# Patient Record
Sex: Female | Born: 1995
Health system: Southern US, Community
[De-identification: ages and names within clinical notes are randomized; demographics above are authoritative.]

---

## 2020-02-29 ENCOUNTER — Ambulatory Visit: Payer: Self-pay

## 2021-03-08 ENCOUNTER — Emergency Department (HOSPITAL_COMMUNITY): Payer: BC Managed Care – PPO

## 2021-03-08 ENCOUNTER — Emergency Department (HOSPITAL_COMMUNITY)
Admission: EM | Admit: 2021-03-08 | Discharge: 2021-03-08 | Disposition: A | Discharge status: Home / Self Care | Payer: BC Managed Care – PPO | Attending: Emergency Medicine | Admitting: Emergency Medicine

## 2021-03-08 ENCOUNTER — Encounter (HOSPITAL_COMMUNITY): Payer: Self-pay | Admitting: Emergency Medicine

## 2021-03-08 DIAGNOSIS — R1013 Epigastric pain: Secondary | ICD-10-CM | POA: Insufficient documentation

## 2021-03-08 DIAGNOSIS — R1011 Right upper quadrant pain: Secondary | ICD-10-CM | POA: Diagnosis not present

## 2021-03-08 DIAGNOSIS — R1010 Upper abdominal pain, unspecified: Secondary | ICD-10-CM

## 2021-03-08 DIAGNOSIS — R1012 Left upper quadrant pain: Secondary | ICD-10-CM | POA: Diagnosis not present

## 2021-03-08 DIAGNOSIS — R197 Diarrhea, unspecified: Secondary | ICD-10-CM | POA: Diagnosis not present

## 2021-03-08 DIAGNOSIS — E876 Hypokalemia: Secondary | ICD-10-CM | POA: Diagnosis not present

## 2021-03-08 DIAGNOSIS — R112 Nausea with vomiting, unspecified: Secondary | ICD-10-CM | POA: Insufficient documentation

## 2021-03-08 LAB — COMPREHENSIVE METABOLIC PANEL
ALT: 23 U/L (ref 0–44)
AST: 34 U/L (ref 15–41)
Albumin: 4 g/dL (ref 3.5–5.0)
Alkaline Phosphatase: 81 U/L (ref 38–126)
Anion gap: 12 (ref 5–15)
BUN: 12 mg/dL (ref 6–20)
CO2: 24 mmol/L (ref 22–32)
Calcium: 8.8 mg/dL — ABNORMAL LOW (ref 8.9–10.3)
Chloride: 101 mmol/L (ref 98–111)
Creatinine, Ser: 0.79 mg/dL (ref 0.44–1.00)
GFR, Estimated: >60 mL/min (ref >60)
Glucose, Bld: 111 mg/dL — ABNORMAL HIGH (ref 70–99)
Potassium: 3.1 mmol/L — ABNORMAL LOW (ref 3.5–5.1)
Sodium: 137 mmol/L (ref 135–145)
Total Bilirubin: 1.2 mg/dL (ref 0.3–1.2)
Total Protein: 7 g/dL (ref 6.5–8.1)

## 2021-03-08 LAB — URINALYSIS, ROUTINE W REFLEX MICROSCOPIC
Bilirubin Urine: NEGATIVE
Glucose, UA: NEGATIVE mg/dL
Ketones, ur: 40 mg/dL — AB
Nitrite: NEGATIVE
Protein, ur: NEGATIVE mg/dL
Specific Gravity, Urine: 1.015 (ref 1.005–1.030)
pH: 5.5 (ref 5.0–8.0)

## 2021-03-08 LAB — TROPONIN I (HIGH SENSITIVITY): Troponin I (High Sensitivity): 3 ng/L (ref <18)

## 2021-03-08 LAB — I-STAT BETA HCG BLOOD, ED (MC, WL, AP ONLY): I-stat hCG, quantitative: <5 m[IU]/mL (ref <5)

## 2021-03-08 LAB — CBC
HCT: 42.4 % (ref 36.0–46.0)
Hemoglobin: 14.2 g/dL (ref 12.0–15.0)
MCH: 30.5 pg (ref 26.0–34.0)
MCHC: 33.5 g/dL (ref 30.0–36.0)
MCV: 91 fL (ref 80.0–100.0)
Platelets: 193 10*3/uL (ref 150–400)
RBC: 4.66 MIL/uL (ref 3.87–5.11)
RDW: 12.1 % (ref 11.5–15.5)
WBC: 6.6 10*3/uL (ref 4.0–10.5)
nRBC: 0 % (ref 0.0–0.2)

## 2021-03-08 LAB — URINALYSIS, MICROSCOPIC (REFLEX)

## 2021-03-08 LAB — LIPASE, BLOOD: Lipase: 30 U/L (ref 11–51)

## 2021-03-08 MED ORDER — OMEPRAZOLE 20 MG PO CPDR: 20.0000 mg | DELAYED_RELEASE_CAPSULE | Freq: Every day | ORAL | 0 refills | Status: AC

## 2021-03-08 NOTE — Discharge Instructions (Signed)
You were seen in the emergency department for upper abdominal pain.  You had lab work urinalysis and a right upper quadrant ultrasound that did not show any significant abnormalities.  We are starting you on some acid medication.  Please follow-up with your primary care doctor.  Return to the emergency department if any worsening or concerning symptoms.

## 2021-03-08 NOTE — ED Notes (Signed)
Patient transported to X-ray 

## 2021-03-08 NOTE — ED Provider Notes (Signed)
[facility] EMERGENCY DEPARTMENT Provider Note   CSN: [id number] Arrival date & time: 03/08/21  5176     History No chief complaint on file.   Ruth Cancio is a 25 y.o. female.  She is here with a complaint of abdominal pain.  She said 3 days ago she had crampy abdominal pain one episode of vomiting and 2 episodes of diarrhea associated with some feeling feverish.  Yesterday was fine.  This morning at 5 AM she was acutely woken up with upper abdominal pain radiating to her chest and back.  Stabbing and crampy in nature.  Associated with nausea.  Rates it as 9 out of 10 at its worst currently 0 out of 10.  No urinary symptoms.  Last menstrual period was 4 days ago and has completed.  The history is provided by the patient.  Abdominal Pain Pain location:  Epigastric, RUQ and LUQ Pain quality: cramping and stabbing   Pain radiates to:  Chest and back Pain severity:  Severe Onset quality:  Sudden Timing:  Constant Progression:  Resolved Chronicity:  New Context: not trauma   Relieved by:  None tried Worsened by:  Nothing Ineffective treatments:  None tried Associated symptoms: diarrhea, fever, nausea and vomiting   Associated symptoms: no anorexia, no chest pain, no constipation, no cough, no dysuria, no hematemesis, no hematochezia, no hematuria, no shortness of breath and no sore throat        History reviewed. No pertinent past medical history.  There are no problems to display for this patient.   History reviewed. No pertinent surgical history.   OB History   No obstetric history on file.     History reviewed. No pertinent family history.     Home Medications Prior to Admission medications   Not on File    Allergies    Patient has no allergy information on record.  Review of Systems   Review of Systems  Constitutional: Positive for fever.  HENT: Negative for sore throat.   Eyes: Negative for visual disturbance.  Respiratory: Negative for  cough and shortness of breath.   Cardiovascular: Negative for chest pain.  Gastrointestinal: Positive for abdominal pain, diarrhea, nausea and vomiting. Negative for anorexia, constipation, hematemesis and hematochezia.  Genitourinary: Negative for dysuria and hematuria.  Musculoskeletal: Positive for back pain.  Skin: Negative for rash.  Neurological: Negative for headaches.    Physical Exam Updated Vital Signs BP (!) 143/89 (BP Location: Right Arm)   Pulse 95   Resp 18   SpO2 98%   Physical Exam Vitals and nursing note reviewed.  Constitutional:      General: She is not in acute distress.    Appearance: Normal appearance. She is well-developed.  HENT:     Head: Normocephalic and atraumatic.  Eyes:     Conjunctiva/sclera: Conjunctivae normal.  Cardiovascular:     Rate and Rhythm: Normal rate and regular rhythm.     Heart sounds: No murmur heard.   Pulmonary:     Effort: Pulmonary effort is normal. No respiratory distress.     Breath sounds: Normal breath sounds.  Abdominal:     Palpations: Abdomen is soft.     Tenderness: There is no abdominal tenderness. There is no guarding or rebound.  Musculoskeletal:        General: Normal range of motion.     Cervical back: Neck supple.     Right lower leg: No edema.     Left lower leg: No edema.  Skin:    General: Skin is warm and dry.     Capillary Refill: Capillary refill takes less than 2 seconds.  Neurological:     General: No focal deficit present.     Mental Status: She is alert.     ED Results / Procedures / Treatments   Labs (all labs ordered are listed, but only abnormal results are displayed) Labs Reviewed  COMPREHENSIVE METABOLIC PANEL - Abnormal; Notable for the following components:      Result Value   Potassium 3.1 (*)    Glucose, Bld 111 (*)    Calcium 8.8 (*)    All other components within normal limits  URINALYSIS, ROUTINE W REFLEX MICROSCOPIC - Abnormal; Notable for the following components:    Color, Urine AMBER (*)    APPearance TURBID (*)    Hgb urine dipstick LARGE (*)    Ketones, ur 40 (*)    Leukocytes,Ua SMALL (*)    All other components within normal limits  URINALYSIS, MICROSCOPIC (REFLEX) - Abnormal; Notable for the following components:   Bacteria, UA MANY (*)    All other components within normal limits  CBC  LIPASE, BLOOD  I-STAT BETA HCG BLOOD, ED ([facility], [facility], [facility] ONLY)  TROPONIN I (HIGH SENSITIVITY)    EKG EKG Interpretation  Date/Time:  Monday March 08 2021 06:45:23 EDT Ventricular Rate:  108 PR Interval:  126 QRS Duration: 82 QT Interval:  340 QTC Calculation: 455 R Axis:   70 Text Interpretation: Sinus tachycardia ST & T wave abnormality, consider inferior ischemia ST & T wave abnormality, consider anterolateral ischemia Abnormal ECG No old tracing to compare Confirmed by Meridee Score [phone]) on 03/08/2021 6:58:00 AM   Radiology DG Chest 2 View  Result Date: 03/08/2021 CLINICAL DATA:  Vomiting, fever, upper abdominal pain EXAM: CHEST - 2 VIEW COMPARISON:  None. FINDINGS: The lungs are clear and negative for focal airspace consolidation, pulmonary edema or suspicious pulmonary nodule. No pleural effusion or pneumothorax. Cardiac and mediastinal contours are within normal limits. No acute fracture or lytic or blastic osseous lesions. The visualized upper abdominal bowel gas pattern is unremarkable. IMPRESSION: Normal chest x-ray. Electronically Signed   By: Malachy Moan M.D.   On: 03/08/2021 07:39   US Abdomen Limited RUQ (LIVER/GB)  Result Date: 03/08/2021 CLINICAL DATA:  25 year old female with upper abdominal pain for 3 days. EXAM: ULTRASOUND ABDOMEN LIMITED RIGHT UPPER QUADRANT COMPARISON:  None. FINDINGS: Gallbladder: No gallstones or wall thickening visualized. No sonographic Murphy sign noted by sonographer. Common bile duct: Diameter: 2 mm, normal. Liver: No focal lesion identified. Within normal limits in parenchymal echogenicity. Portal vein is  patent on color Doppler imaging with normal direction of blood flow towards the liver. Other: Negative visible right kidney. IMPRESSION: Normal right upper quadrant ultrasound. Electronically Signed   By: Odessa Fleming M.D.   On: 03/08/2021 09:18    Procedures Procedures   Medications Ordered in ED Medications - No data to display  ED Course  I have reviewed the triage vital signs and the nursing notes.  Pertinent labs & imaging results that were available during my care of the patient were reviewed by me and considered in my medical decision making (see chart for details).  Clinical Course as of 03/08/21 1701  Mon Mar 08, 2021  6045 Chest x-ray interpreted by me as no acute infiltrates. [MB]  1025 Reviewed results with patient and her mom.  They are comfortable plan for discharge and return instructions discussed [MB]  Clinical Course User Index [MB] Terrilee Files, MD   MDM Rules/Calculators/A&P                         This patient complains of upper abdominal pain radiating to chest and back; this involves an extensive number of treatment Options and is a complaint that carries with it a high risk of complications and Morbidity. The differential includes peptic ulcer disease, gastritis, cholelithiasis, cholecystitis, perforation  I ordered, reviewed and interpreted labs, which included CBC with normal white count normal hemoglobin, chemistries normal other than mildly low potassium, pregnancy test negative, troponin low and does not need delta I ordered imaging studies which included right upper quadrant ultrasound and I independently    visualized and interpreted imaging which showed no acute findings Additional history obtained from patient's mother Previous records obtained and reviewed in epic, no recent admissions  After the interventions stated above, I reevaluated the patient and found patient had complete resolution of symptoms upon arrival.  She is asking to be  discharged.  We will trial her on PPI.  Urinalysis had not resulted, patient's discharge.  Commend close follow-up with PCP.   Final Clinical Impression(s) / ED Diagnoses Final diagnoses:  Upper abdominal pain    Rx / DC Orders ED Discharge Orders    None       Terrilee Files, MD 03/08/21 [phone]

## 2021-03-08 NOTE — ED Notes (Signed)
Patient transported to Ultrasound 

## 2021-03-08 NOTE — ED Triage Notes (Signed)
Pt here from home with c/o upper epigastric pain and chest pain , pt was sick over the weekend with a fever

## 2021-09-06 IMAGING — DX DG CHEST 2V
2 series · 2 of 2 positions shown · non-contrast
Comparison: None.

CLINICAL DATA: Vomiting, fever, upper abdominal pain

EXAM:
CHEST - 2 VIEW

[chest pa]
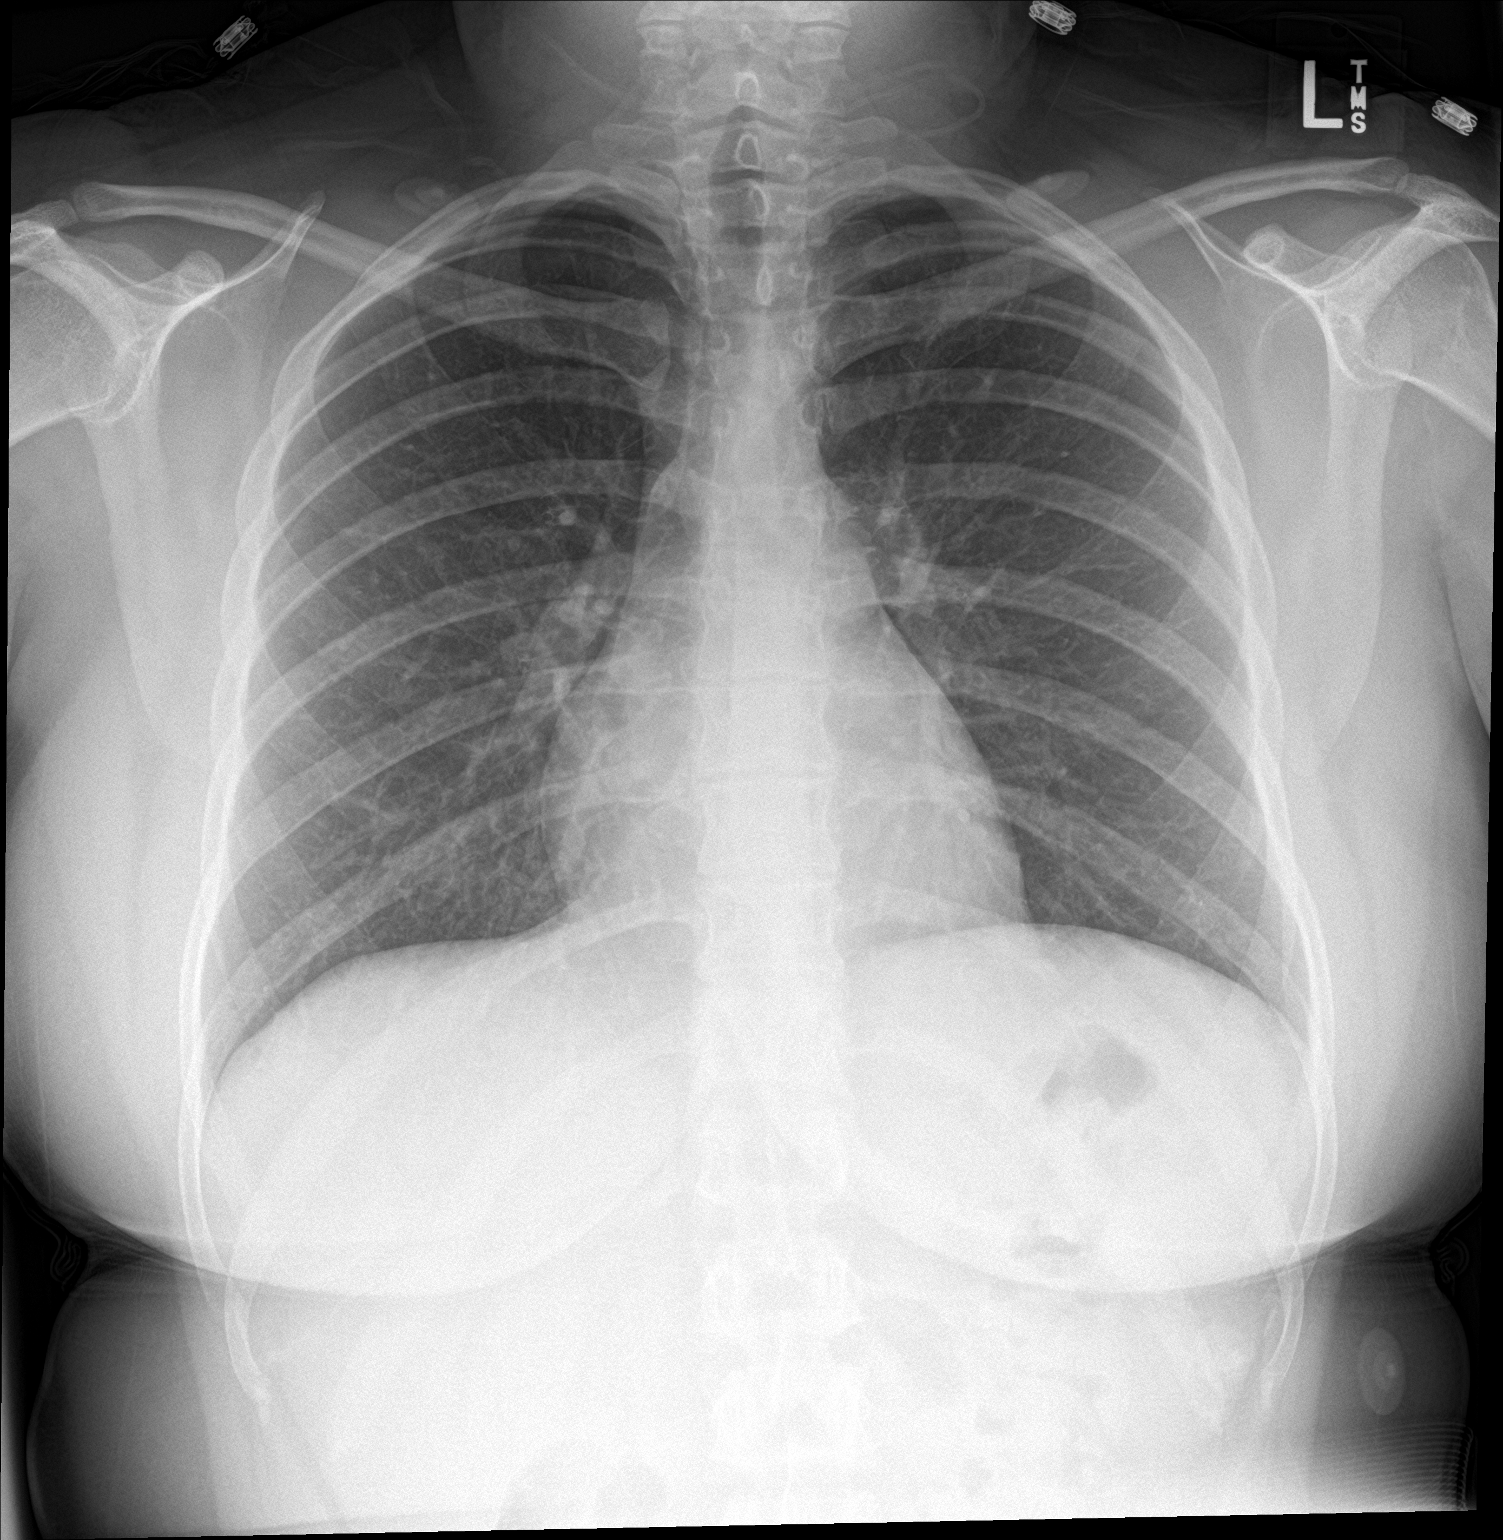

[chest lat]
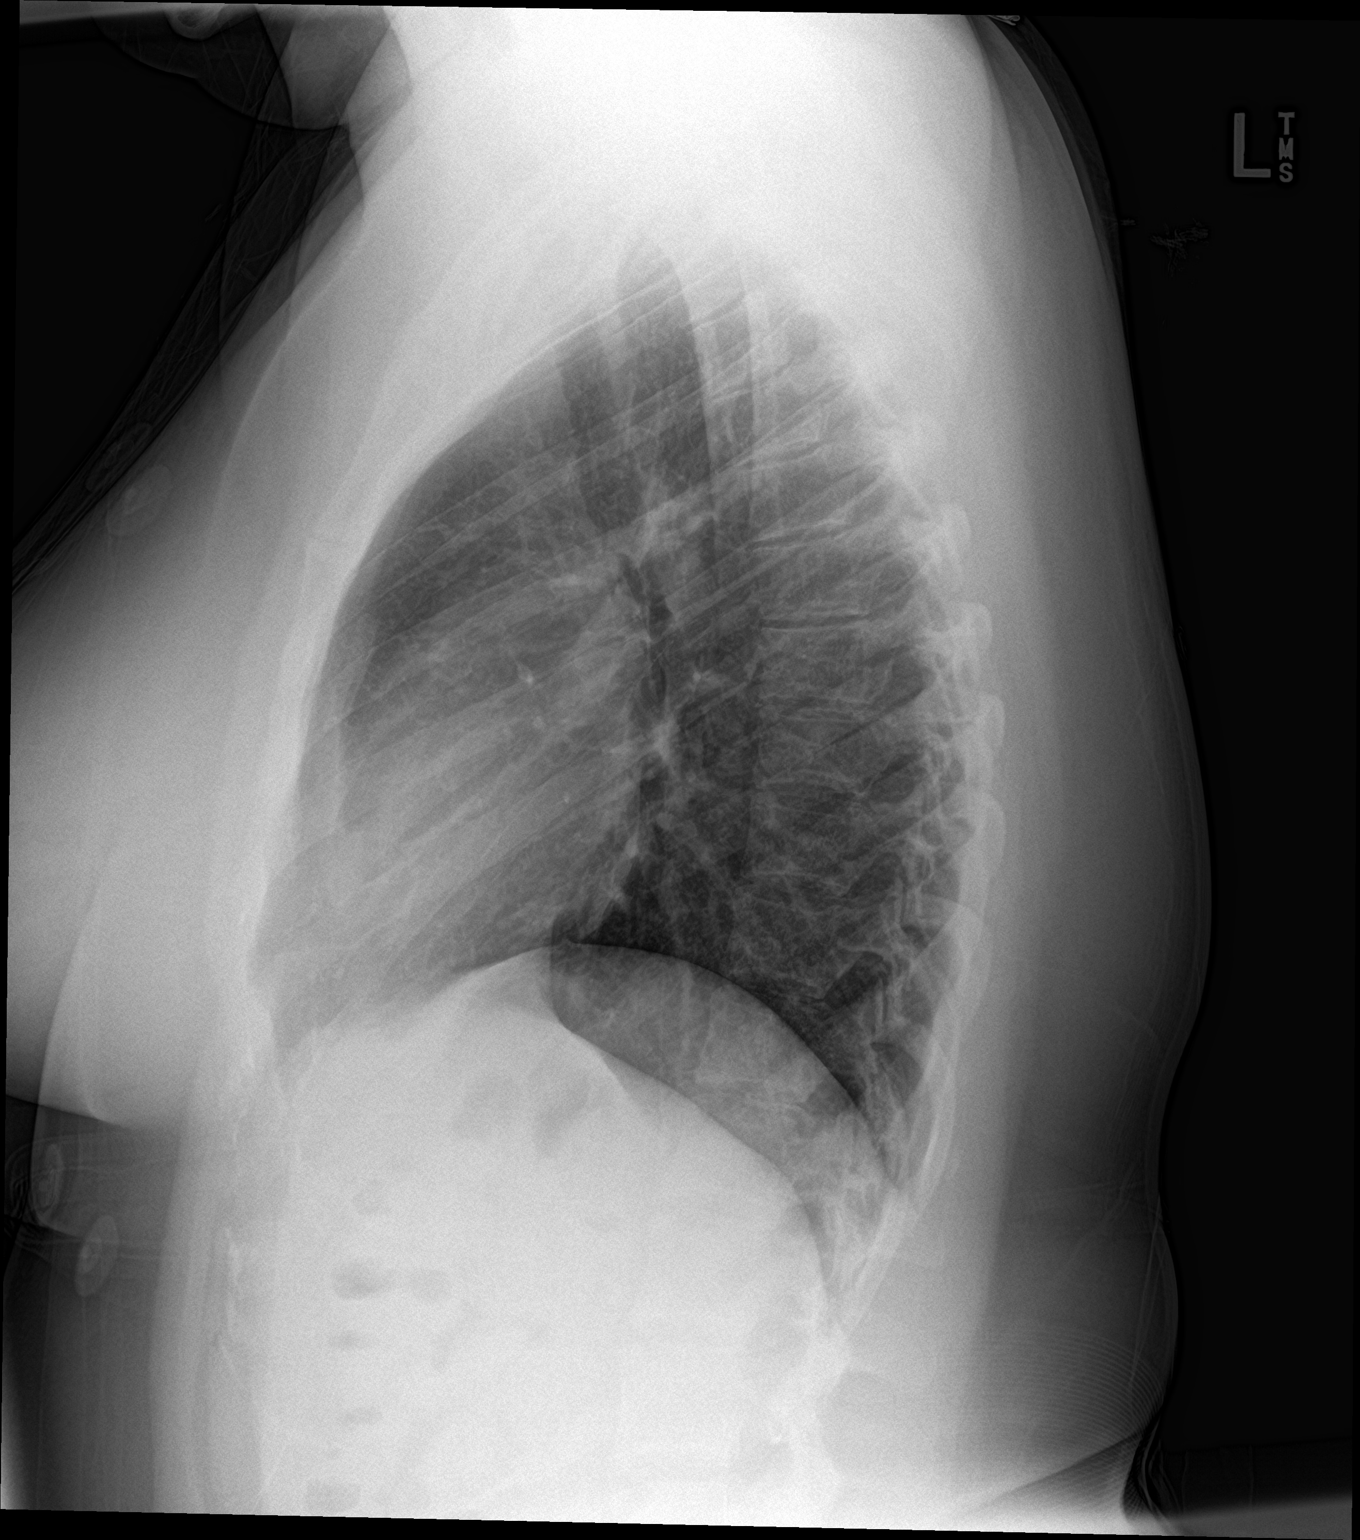

[2 of 2 positions shown; findings below may reference images not displayed]

FINDINGS: The lungs are clear and negative for focal airspace consolidation,
pulmonary edema or suspicious pulmonary nodule. No pleural effusion
or pneumothorax. Cardiac and mediastinal contours are within normal
limits. No acute fracture or lytic or blastic osseous lesions. The
visualized upper abdominal bowel gas pattern is unremarkable.
IMPRESSION: Normal chest x-ray.

## 2021-09-06 IMAGING — US US ABDOMEN LIMITED RUQ/ASCITES
1 series · 14 of 25 positions shown · non-contrast
Comparison: None.

CLINICAL DATA: 24-year-old female with upper abdominal pain for 3
days.

EXAM:
ULTRASOUND ABDOMEN LIMITED RIGHT UPPER QUADRANT

[Series 1: us abdomen limited ruq (liver/gb) · 14 of 46 slices shown]
[im 1/46]
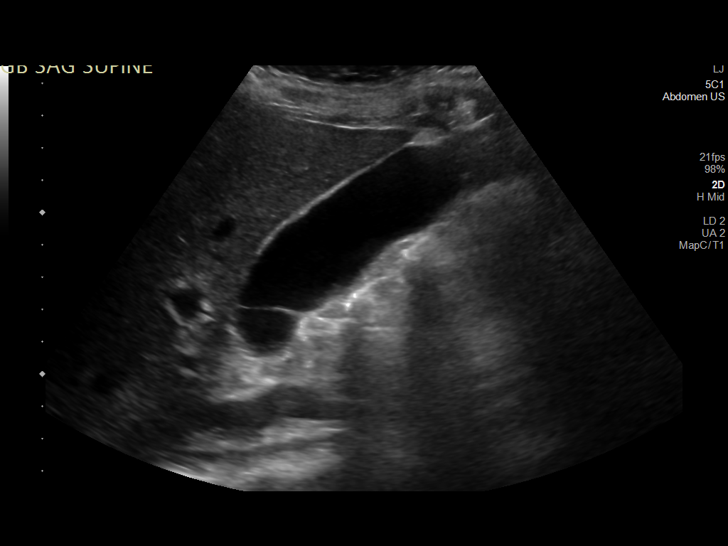
[im 4/46]
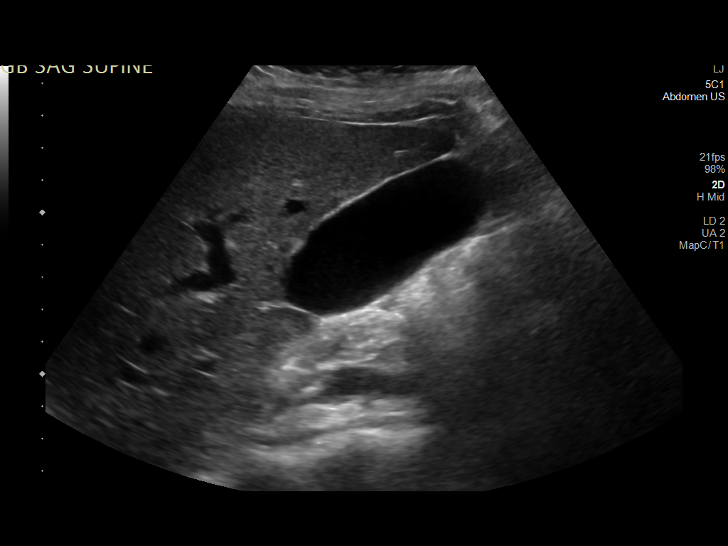
[im 8/46]
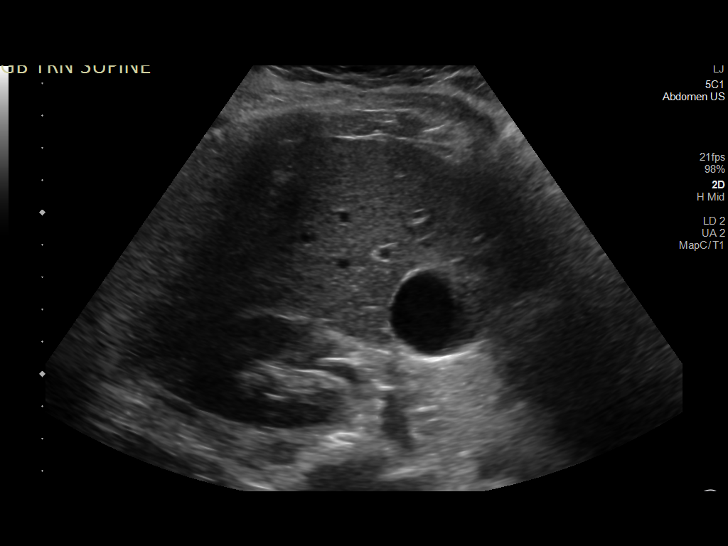
[im 12/46]
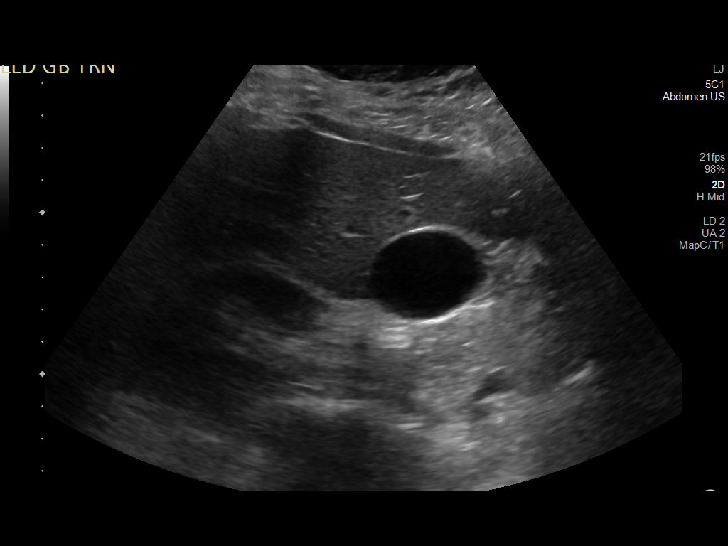
[im 16/46]
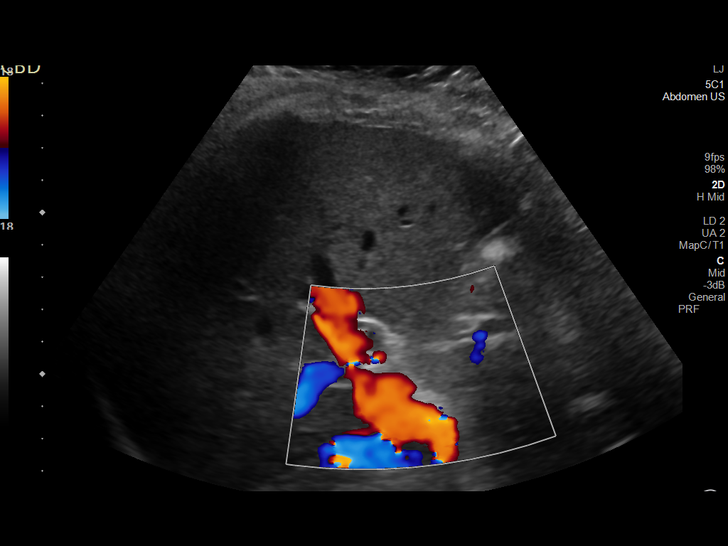
[im 17/46]
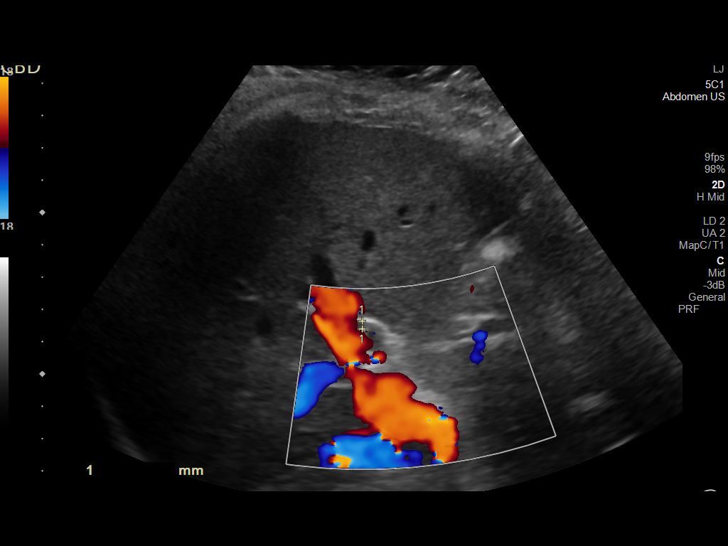
[im 21/46]
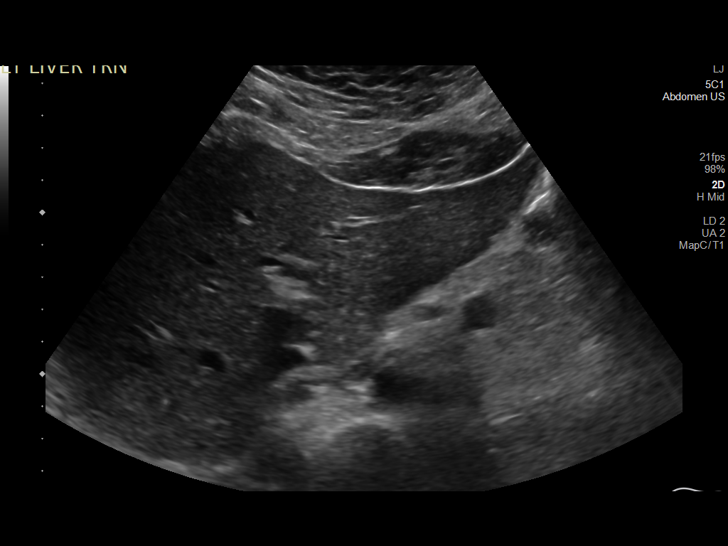
[im 25/46]
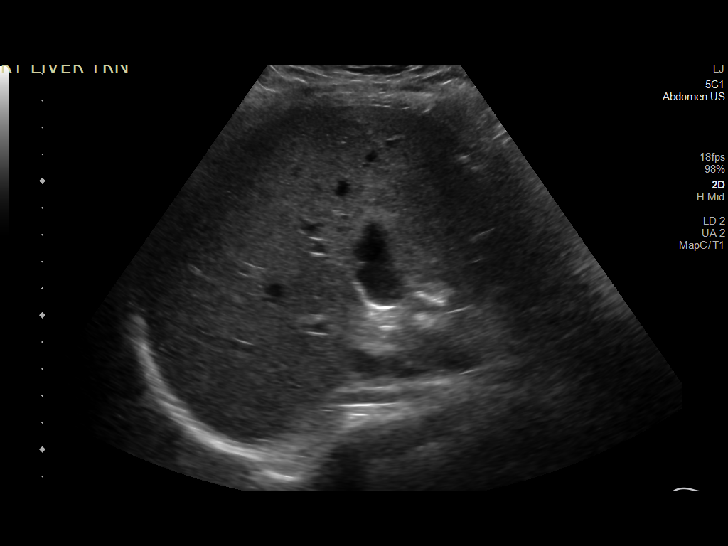
[im 29/46]
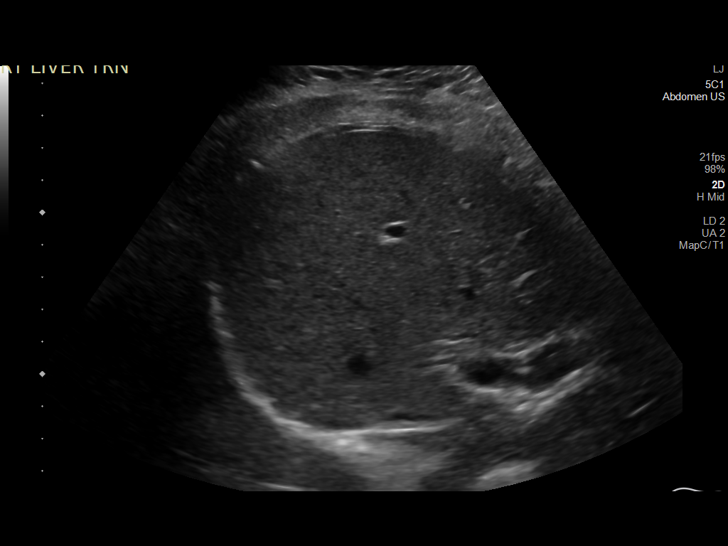
[im 31/46]
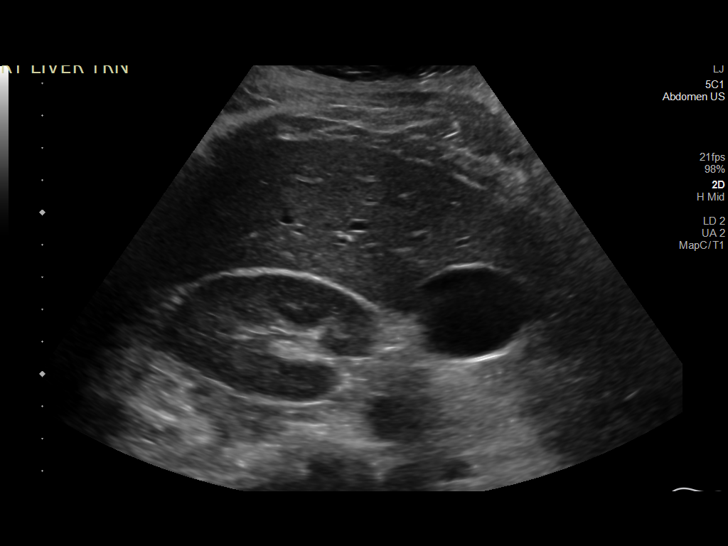
[im 34/46]
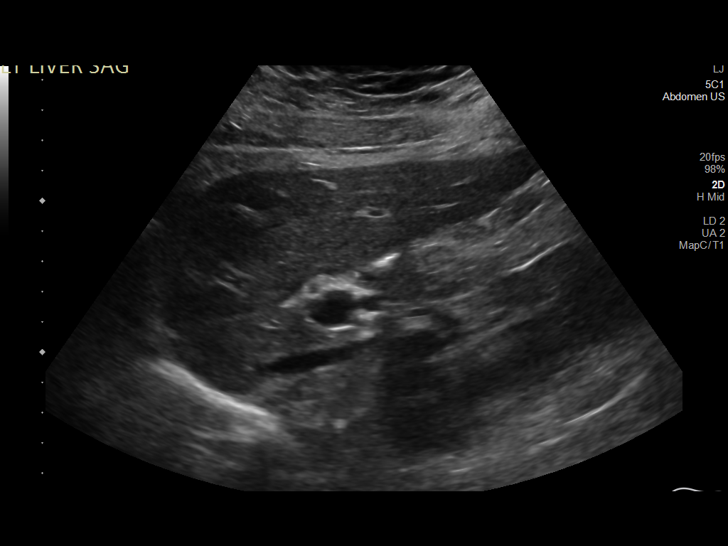
[im 38/46]
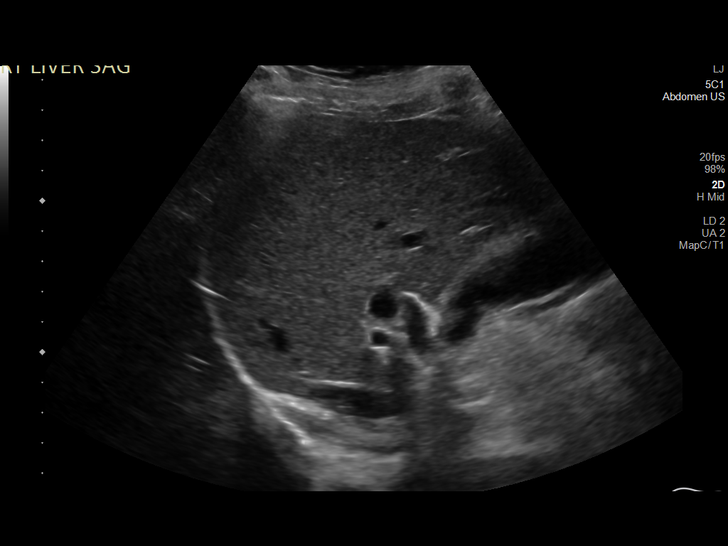
[im 42/46]
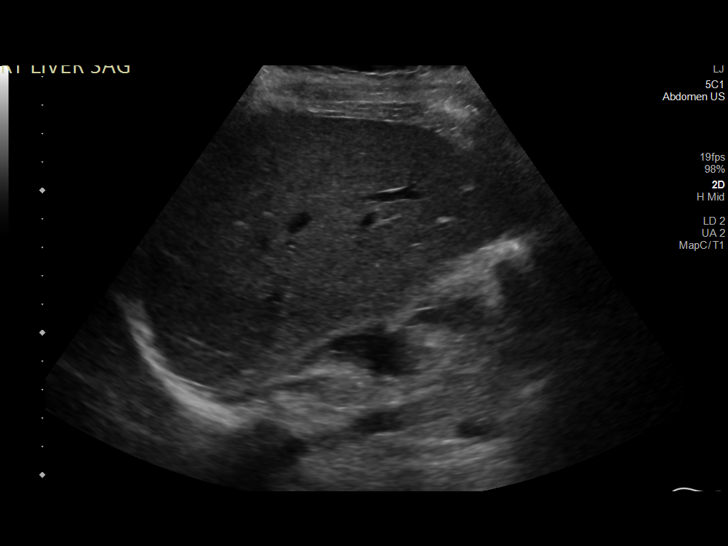
[im 46/46]
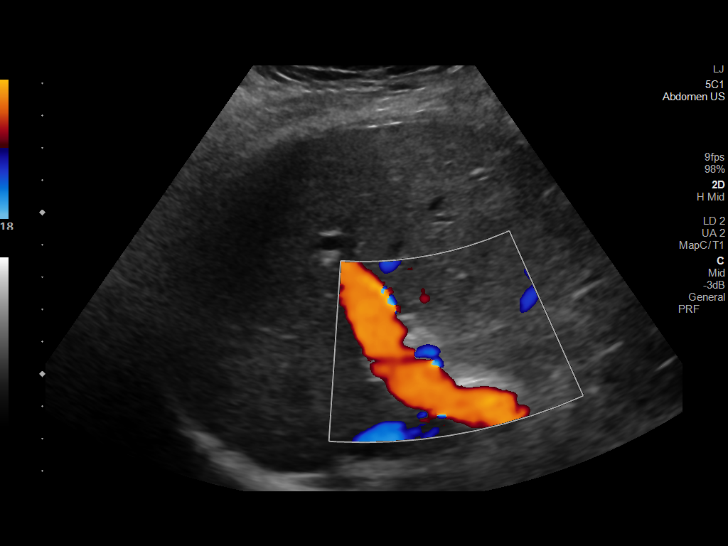

[14 of 25 positions shown; findings below may reference images not displayed]

FINDINGS: Gallbladder:

No gallstones or wall thickening visualized. No sonographic Murphy
sign noted by sonographer.

Common bile duct:

Diameter: 2 mm, normal.

Liver:

No focal lesion identified. Within normal limits in parenchymal
echogenicity. Portal vein is patent on color Doppler imaging with
normal direction of blood flow towards the liver.

Other: Negative visible right kidney.
IMPRESSION: Normal right upper quadrant ultrasound.
